# Patient Record
Sex: Female | Born: 1970 | Race: White | Hispanic: No | Marital: Married | Smoking: Never smoker
Health system: Southern US, Community
[De-identification: ages and names within clinical notes are randomized; demographics above are authoritative.]

## PROBLEM LIST (undated history)

## (undated) DIAGNOSIS — R5383 Other fatigue: Secondary | ICD-10-CM

## (undated) DIAGNOSIS — R002 Palpitations: Secondary | ICD-10-CM

## (undated) DIAGNOSIS — E6609 Other obesity due to excess calories: Secondary | ICD-10-CM

## (undated) DIAGNOSIS — E66811 Obesity, class 1: Secondary | ICD-10-CM

## (undated) DIAGNOSIS — F32A Depression, unspecified: Secondary | ICD-10-CM

## (undated) HISTORY — DX: Other fatigue: R53.83

## (undated) HISTORY — DX: Other obesity due to excess calories: E66.09

## (undated) HISTORY — DX: Depression, unspecified: F32.A

## (undated) HISTORY — DX: Obesity, class 1: E66.811

## (undated) HISTORY — DX: Palpitations: R00.2

---

## 1998-11-18 ENCOUNTER — Ambulatory Visit (HOSPITAL_BASED_OUTPATIENT_CLINIC_OR_DEPARTMENT_OTHER): Admission: RE | Admit: 1998-11-18 | Discharge: 1998-11-18 | Payer: Self-pay | Admitting: Orthopedic Surgery

## 1999-04-07 ENCOUNTER — Other Ambulatory Visit: Admission: RE | Admit: 1999-04-07 | Discharge: 1999-04-07 | Payer: Self-pay | Admitting: *Deleted

## 2000-08-22 ENCOUNTER — Other Ambulatory Visit: Admission: RE | Admit: 2000-08-22 | Discharge: 2000-08-22 | Payer: Self-pay | Admitting: *Deleted

## 2001-10-17 ENCOUNTER — Other Ambulatory Visit: Admission: RE | Admit: 2001-10-17 | Discharge: 2001-10-17 | Payer: Self-pay | Admitting: Obstetrics and Gynecology

## 2003-05-20 ENCOUNTER — Other Ambulatory Visit: Admission: RE | Admit: 2003-05-20 | Discharge: 2003-05-20 | Payer: Self-pay | Admitting: Obstetrics and Gynecology

## 2004-08-19 ENCOUNTER — Ambulatory Visit (HOSPITAL_COMMUNITY): Admission: RE | Admit: 2004-08-19 | Discharge: 2004-08-19 | Payer: Self-pay | Admitting: Orthopedic Surgery

## 2005-05-28 ENCOUNTER — Emergency Department (HOSPITAL_COMMUNITY): Admission: EM | Admit: 2005-05-28 | Discharge: 2005-05-28 | Payer: Self-pay | Admitting: Emergency Medicine

## 2005-07-17 ENCOUNTER — Ambulatory Visit: Payer: Self-pay | Admitting: Hematology & Oncology

## 2005-09-04 ENCOUNTER — Ambulatory Visit: Payer: Self-pay | Admitting: Internal Medicine

## 2005-09-19 ENCOUNTER — Ambulatory Visit: Payer: Self-pay | Admitting: Internal Medicine

## 2005-10-26 ENCOUNTER — Ambulatory Visit: Payer: Self-pay | Admitting: Internal Medicine

## 2006-03-11 ENCOUNTER — Encounter (INDEPENDENT_AMBULATORY_CARE_PROVIDER_SITE_OTHER): Payer: Self-pay | Admitting: *Deleted

## 2006-03-11 ENCOUNTER — Inpatient Hospital Stay (HOSPITAL_COMMUNITY): Admission: AD | Admit: 2006-03-11 | Discharge: 2006-03-14 | Payer: Self-pay | Admitting: Obstetrics and Gynecology

## 2006-03-18 ENCOUNTER — Inpatient Hospital Stay (HOSPITAL_COMMUNITY): Admission: AD | Admit: 2006-03-18 | Discharge: 2006-03-18 | Payer: Self-pay | Admitting: Obstetrics and Gynecology

## 2008-06-24 ENCOUNTER — Encounter: Admission: RE | Admit: 2008-06-24 | Discharge: 2008-06-24 | Payer: Self-pay | Admitting: Obstetrics and Gynecology

## 2008-06-25 ENCOUNTER — Encounter: Admission: RE | Admit: 2008-06-25 | Discharge: 2008-06-25 | Payer: Self-pay | Admitting: Obstetrics and Gynecology

## 2010-06-13 ENCOUNTER — Encounter: Payer: Self-pay | Admitting: Pulmonary Disease

## 2010-06-14 ENCOUNTER — Encounter: Payer: Self-pay | Admitting: Pulmonary Disease

## 2010-06-14 ENCOUNTER — Encounter: Admission: RE | Admit: 2010-06-14 | Discharge: 2010-06-14 | Payer: Self-pay | Admitting: Sports Medicine

## 2010-06-15 ENCOUNTER — Ambulatory Visit: Payer: Self-pay | Admitting: Pulmonary Disease

## 2010-06-15 DIAGNOSIS — R930 Abnormal findings on diagnostic imaging of skull and head, not elsewhere classified: Secondary | ICD-10-CM | POA: Insufficient documentation

## 2010-06-15 DIAGNOSIS — J45909 Unspecified asthma, uncomplicated: Secondary | ICD-10-CM | POA: Insufficient documentation

## 2010-06-29 ENCOUNTER — Ambulatory Visit: Payer: Self-pay | Admitting: Pulmonary Disease

## 2010-06-29 ENCOUNTER — Encounter: Payer: Self-pay | Admitting: Pulmonary Disease

## 2010-06-29 DIAGNOSIS — J069 Acute upper respiratory infection, unspecified: Secondary | ICD-10-CM | POA: Insufficient documentation

## 2010-08-11 ENCOUNTER — Ambulatory Visit: Payer: Self-pay | Admitting: Pulmonary Disease

## 2010-08-30 ENCOUNTER — Telehealth: Payer: Self-pay | Admitting: Pulmonary Disease

## 2010-08-30 ENCOUNTER — Encounter: Admission: RE | Admit: 2010-08-30 | Discharge: 2010-08-30 | Payer: Self-pay | Admitting: Pulmonary Disease

## 2010-08-31 DIAGNOSIS — J984 Other disorders of lung: Secondary | ICD-10-CM | POA: Insufficient documentation

## 2010-11-24 NOTE — Assessment & Plan Note (Signed)
Summary: chest pain,dizzy/abn ct/apc   Visit Type:  Initial Consult Copy to:  pcp Primary Provider/Referring Provider:  Dr. Frazier Butt  CC:  Pt here for pulmonary consult. Pt states seen by PW in the past. Pt c/o extreme chest pain radiating up right jaw and head. Pt Spiral CT Chest done yesterday.  Pt c/o fever yesterday T-100.2.  History of Present Illness: 40 yo female with abnormal CT chest.  She was in her usual state of health until 3 days ago.  She was sitting at the kitchen counter with her son when she suddenly developed severe, sharp chest pain.  This radiated to her neck and face.  She then had trouble focusing with her vision for about 3 minutes.  The intensity of the pain improved some, but she still gets discomfort when she takes deep breath.  She feels like somebody has kicked her in the back.  She has never had anything like this happen before.  She started running a low grade temperature since yesterday.  The highest her temperature has been is up to 100.46F.  She has not taken anything specifically for this.  She has also noticed more trouble with her breathing.  This is mostly with exertion.  She feels like she can't take a deep breath.  She has not had wheezing.  She has a history of asthma since childhood, and has been using her ventolin.  This has not seemed to help.  She was seen previously by Dr. Shan Levans for her asthma during her pregnancy.  Since then her asthma has been under control.  She has not had much cough.  She denies sputum production, or hemoptysis.  Her sinuses have been okay.  She does have a scratchy throat, and some hoarseness.  She gets allergies when around cats, dust, and the change of seasons.  She denies gland swelling, joint swelling, leg swelling, or skin rashes.  She does feel fatigued, but states this has been present for some time.  Her fatigue is more pronounced with her current symptom constellation.  She works as a Engineer, site in an  Scientist, forensic.  She denies sick exposure, or travel history.  There has been no animal exposures.  There is no prior history pneumonia or TB.  She has not had previous chest xrays that she can recall.  There is no history of tobacco use or exposure.  She was started on a Zpak yesterday by Dr. Margaretha Sheffield.  She has been feeling better over the past day. Lab test from August 23 showed mildly elevated ESR at 25, and increased percentage of granulocytes at 78% with total WBC count of 10.  Her ACE level was still pending.  Remainder of lab work was reviewed, and unremarkable.  She had ECG from August 22 which showed normal sinus rhythm.  CT of Chest  Procedure date:  06/14/2010  Findings:       Clinical Data:  Chest pain radiating up into the neck.  Shortness   of breath.  Fever.    CT ANGIOGRAPHY CHEST WITH CONTRAST    Technique:  Multidetector CT imaging of the chest was performed   using the standard protocol during bolus administration of   intravenous contrast.  Multiplanar CT image reconstructions   including MIPs were obtained to evaluate the vascular anatomy.    Contrast:  125 ml Omnipaque-300    Comparison:  None available.    Findings:  No filling defect is identified in the pulmonary  arterial tree to suggest pulmonary embolus.    A right hilar node measures 0.8 x 1.5 cm.  A right lower hilar node   measures 1.3 x 0.9 cm.  A left hilar lymph node measures 0.8 x 1.3   cm.  An AP window node measures 1.7 x 0.5 cm.    No pleural effusion identified.    A right middle lobe nodule measures 3 mm in diameter on image 63 of   series 5.  A separate right middle lobe nodule measures 4 mm on   image 70 of series 5.  Left lower lobe nodules include a 0.6 x 0.5   cm nodule on image 74, a 0.7 x 0.4 cm nodule on image 73, and   several additional similar sized right lower lobe nodules.  A 3 mm   nodule is noted in the left upper lobe on image 41 of series 5,   along with several small  left lower lobe pulmonary nodules.    Review of the MIP images confirms the above findings.    Comments:      IMPRESSION:    1.  Multiple scattered small pulmonary nodules in the 5 mm range,   particularly in the lower lobes, with associated borderline hilar   adenopathy.  Most of the nodules are peripheral in location,   although not all.  It is difficult to characterize the nodule   location as perilymphatic versus centrilobular, and accordingly   differential diagnostic considerations include entities such as   sarcoidosis, atypical infectious bronchiolitis, fungal disease, or   metastatic lesions.  These nodules are too small to satisfactorily   characterize by PET CT.  Correlate with the patient's symptoms and   consider CT followup in order to ensure resolution.   2.  No embolus identified.   Preventive Screening-Counseling & Management  Alcohol-Tobacco     Smoking Status: never  Current Medications (verified): 1)  Ventolin Hfa 108 (90 Base) Mcg/act Aers (Albuterol Sulfate) .... As Needed  Allergies (verified): 1)  ! 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  Smoking Status:  never  Vital Signs:  Patient profile:   40 year old female Height:      67 inches Weight:      172 pounds BMI:     27.04 O2 Sat:      100 % on Room air Temp:     99.2 degrees F oral Pulse rate:   88 / minute BP sitting:   110 / 68  (left arm) Cuff size:   regular  Vitals Entered By: Zackery Barefoot CMA (June 15, 2010 9:48 AM)  O2 Flow:  Room air CC: Pt here for pulmonary consult. Pt states seen by PW in the past. Pt c/o  extreme chest pain radiating up right jaw and head. Pt Spiral CT Chest done yesterday.  Pt c/o fever yesterday T-100.2 Comments Medications reviewed with patient Verified contact number and pharmacy with patient Zackery Barefoot CMA  June 15, 2010 9:49 AM    Physical Exam  General:  normal appearance, healthy appearing, and obese.   Eyes:  PERRLA and EOMI.   Ears:  TMs intact and clear with normal canals Nose:  Narrow nasal angles, no discharge Mouth:  No exudate Neck:  no JVD, no thyromegaly Chest Wall:  no deformities noted Lungs:  clear bilaterally to auscultation and percussion Heart:  regular rate and rhythm, S1, S2 without murmurs, rubs, gallops, or clicks Abdomen:  bowel sounds positive; abdomen soft and non-tender without masses, or organomegaly Msk:  no deformity or scoliosis noted with normal posture Pulses:  pulses normal Extremities:  no clubbing, cyanosis, edema, or deformity noted Neurologic:  normal CN II-XII and strength normal.   Cervical Nodes:  no significant adenopathy Axillary Nodes:  no significant adenopathy Psych:  alert and cooperative; normal mood and affect; normal attention span and concentration   Impression & Recommendations:  Problem # 1:  CT, CHEST, ABNORMAL (ICD-793.1)  She has acute onset of low grade fever, pleuritic type chest pain, and dyspnea on exertion.  She has radiographic findings as detailed above.  My suspicion is that this is infectious.  I have advised her to complete her course of zithromax.  Will re-assess her symptom status, and arrange for chest xray in two weeks.    Depending upon her xray findings and her clinical status at next follow up will decide if she needs to have lung tissue sampling.  Explained that if we need to proceed with biopsy, then bronchoscopy would be first option.  I have explained the procedure of bronchoscopy to the patient.  Risks were detailed as bleeding, infection, pneumothorax, and non-diagnosis.  If  she has clinically improved and her chest xray is unremarkable at next follow up in 2 weeks, then I would have her repeat CT chest in 6 to 8 weeks after her first CT chest to determine if her radiographic findings have cleared.  I have explained that waiting this amount of time is unlikely to change diagnostic or treatment options, but may allow to avoid her undergoing an invasive procedure.  Also advised that if her symptoms progress, or if she develops new symptoms, that she should call to arrange for earlier follow up evaluation.  Problem # 2:  ASTHMA (ICD-493.90) She did not have clinical evidence for bronchospasm on exam today.  I don't think she needs systemic corticosteroids at this time.  She can continue as needed ventolin.  Medications Added to Medication List This Visit: 1)  Ventolin Hfa 108 (90 Base) Mcg/act Aers (Albuterol sulfate) .... As needed 2)  Zithromax Z-pak 250 Mg Tabs (Azithromycin) .... Use as directed  Complete Medication List: 1)  Ventolin Hfa 108 (90 Base) Mcg/act Aers (Albuterol sulfate) .... As needed 2)  Zithromax Z-pak 250 Mg Tabs (Azithromycin) .... Use as directed  Other Orders: Consultation Level IV (09811)  Patient Instructions: 1)  Finish course of zithromax 2)  Ventolin 2 puffs as needed for cough, congestion, wheeze, or shortness of breath 3)  Follow up in two weeks with chest xray   Immunization History:  Influenza Immunization History:    Influenza:  historical (07/26/2009)

## 2010-11-24 NOTE — Miscellaneous (Signed)
Summary: Orders Update  Clinical Lists Changes  Orders: Added new Test order of T-2 View CXR (71020TC) - Signed 

## 2010-11-24 NOTE — Letter (Signed)
Summary: [facility]  [facility]   Imported By: Lester [place] 07/04/2010 10:58:47  _____________________________________________________________________  External Attachment:    Type:   Image     Comment:   External Document

## 2010-11-24 NOTE — Assessment & Plan Note (Signed)
Summary: rov w/ cxr 2 wks ///kp   Visit Type:  Follow-up Copy to:  pcp Primary Provider/Referring Provider:  Dr. Frazier Butt  CC:  Follow-up today with CXR...she c/o cough and nasal drainage x1 day...nasal drainage is clear...scratchy throat.  History of Present Illness: 40 yo female with abnormal CT chest.  She was doing well until the past few days.  She thinks she picked up a cold from her son.  She has been getting sinus congestion.  She also has a heavy feeling in her chest.  She has been using her inhaler more, and this helps.  She is using her ventoling twice per day.  She denies chest pain, sputum, hemoptysis, or fever.  She has an occasional cough with clear sputum.    Lab test from August 23 showed mildly elevated ESR at 25, and increased percentage of granulocytes at 78% with total WBC count of 10.  Her ACE level was still pending.  Remainder of lab work was reviewed, and unremarkable.  She had ECG from August 22 which showed normal sinus rhythm.  CXR  Procedure date:  06/29/2010  Findings:      CHEST - 2 VIEW   Comparison: Chest CT 06/14/2010   Findings: Normal mediastinum and heart silhouette.  Costophrenic angles are clear.  No evidence effusion, infiltrate, or pneumothorax.  Right basilar nodularity is barely visible but appears unchanged from CT.   IMPRESSION:   1.  No acute cardiopulmonary process. 2.  No significant change from CT of 06/14/2010.     Current Medications (verified): 1)  Ventolin Hfa 108 (90 Base) Mcg/act Aers (Albuterol Sulfate) .... As Needed 2)  Tylenol Extra Strength 500 Mg Tabs (Acetaminophen) .Marland Kitchen.. 1-2 By Mouth As Needed  Allergies (verified): 1)  ! 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   Review of Systems       The patient complains of productive cough, non-productive cough, nasal congestion/difficulty breathing through nose, and sneezing.  The patient denies shortness of breath with activity, shortness of breath at rest, coughing up blood, chest pain, irregular heartbeats, acid heartburn, indigestion, abdominal pain, difficulty swallowing, sore throat, headaches, ear ache, hand/feet swelling, joint stiffness or pain, rash, change in color of mucus, and fever.    Vital Signs:  Patient profile:   40 year old female Height:      67 inches (170.18 cm) Weight:      174.50 pounds (79.32 kg) BMI:     27.43 O2 Sat:      96 % on Room air Temp:     98.0 degrees F (36.67 degrees C) oral Pulse rate:   72 / minute BP sitting:   102 / 68  (right arm) Cuff size:   regular  Vitals Entered By: Michel Bickers CMA (June 29, 2010 3:53 PM)  O2 Sat at Rest %:  96 O2 Flow:  Room air CC: Follow-up today with CXR...she c/o cough and nasal drainage x1 day...nasal drainage is clear...scratchy throat Comments Medications reviewed with the patient. Daytime phone verified. Michel Bickers [facility]  June 29, 2010 4:10 PM   Physical Exam  General:  normal appearance, healthy appearing, and obese.  Nose:  Narrow nasal angles, clear nasal discharge, no tenderness Mouth:  No exudate Neck:  no JVD, no thyromegaly Lungs:  clear bilaterally to auscultation and percussion Heart:  regular rate and rhythm, S1, S2 without murmurs, rubs, gallops, or clicks Abdomen:  bowel sounds positive; abdomen soft and non-tender without masses, or organomegaly Extremities:  no clubbing, cyanosis, edema, or deformity noted Neurologic:  normal CN II-XII and strength normal.   Cervical Nodes:   no significant adenopathy Axillary Nodes:  no significant adenopathy   Impression & Recommendations:  Problem # 1:  CT, CHEST, ABNORMAL (ICD-793.1) Her chest xray today was unremarkable.  Will follow up in one month, and then likely plan on repeating CT chest to determine if she has persistent radiographic changes not readily apparent on chest xray.  Problem # 2:  ASTHMA (ICD-493.90) She likely has a mild flare of her asthma related to viral sinus infection.  I don't think she needs antibiotics or prednisone at this time.  I have advised her to start Qvar two puffs two times a day for 2 weeks.  If her symptoms are improved by then, she can use Qvar at bedtime until her next visit.  Will then decide if she needs long term maintenance inhaler therapy for her asthma.  Problem # 3:  URI (ICD-465.9) She likely has a viral URI.  I have recommend symptomatic therapy for now.  Advised her to call if her symptoms progress.  Medications Added to Medication List This Visit: 1)  Tylenol Extra Strength 500 Mg Tabs (Acetaminophen) .Marland Kitchen.. 1-2 by mouth as needed 2)  Qvar 80 Mcg/act Aers (Beclomethasone dipropionate) .... Two puffs two times a day for 2 weeks, then 2 puffs at bedtime  Complete Medication List: 1)  Ventolin Hfa 108 (90 Base) Mcg/act Aers (Albuterol sulfate) .... As needed 2)  Tylenol Extra Strength 500 Mg Tabs (Acetaminophen) .Marland Kitchen.. 1-2 by mouth as needed 3)  Qvar 80 Mcg/act Aers (Beclomethasone dipropionate) .... Two puffs two times a day for 2 weeks, then 2 puffs at bedtime  Other Orders: Est. Patient Level IV (16109)  Patient Instructions: 1)  Qvar two puffs two times a day for 2 weeks, then 2 puffs at bedtime until next visit 2)  Follow up in 4 weeks Prescriptions: QVAR 80 MCG/ACT AERS (BECLOMETHASONE DIPROPIONATE) two puffs two times a day for 2 weeks, then 2 puffs at bedtime  #1 x 6   Entered and Authorized by:   Coralyn Helling MD   Signed by:   Coralyn Helling MD on 06/29/2010   Method  used:   Electronically to        [facility]  [address] [phone]* (retail)       [address]       [place], [place]  [address]       Ph: [phone]       Fax: [phone]   RxID:   [id number]

## 2010-11-24 NOTE — Assessment & Plan Note (Signed)
Summary: 4 WEEKS/ MBW   Copy to:  pcp Primary Provider/Referring Provider:  Dr. Frazier Butt  CC:  Pt stopped qvar because she did not see any difference. Marland Kitchen  History of Present Illness: 40 yo female with abnormal CT chest.  Her breathing has been doing better.  She did not notice any difference from Qvar, and stopped this.  She has not needed to use ventolin for some time.  She is not having cough, wheeze, sputum, fever, sweats, chest pain, hemoptysis, sinus congestion, sore throat, gland swelling, or rash.    Current Medications (verified): 1)  Ventolin Hfa 108 (90 Base) Mcg/act Aers (Albuterol Sulfate) .... As Needed 2)  Tylenol Extra Strength 500 Mg Tabs (Acetaminophen) .Marland Kitchen.. 1-2 By Mouth As Needed  Allergies (verified): 1)  ! Lorcet 10/650 (Hydrocodone-Acetaminophen)  Past History:  Past Surgical History: Last updated: 06/15/2010 C-Section 2007 Left foot surgery  Past Medical History: Asthma Factor V leyden Multiple pulmonary nodules, mediastinal adenopathy       - CT chest 06/14/10  Vital Signs:  Patient profile:   40 year old female Height:      67 inches Weight:      175.25 pounds O2 Sat:      97 % on Room air Temp:     98.5 degrees F oral Pulse rate:   92 / minute BP sitting:   128 / 72  (right arm) Cuff size:   regular  Vitals Entered By: Carron Curie CMA (August 11, 2010 4:30 PM)  O2 Flow:  Room air CC: Pt stopped qvar because she did not see any difference.  Comments Medications reviewed with patient Carron Curie CMA  August 11, 2010 4:31 PM    Physical Exam  General:  normal appearance, healthy appearing, and obese.   Nose:  Narrow nasal angles, clear nasal discharge, no tenderness Mouth:  No exudate Neck:  no JVD, no thyromegaly Lungs:  clear bilaterally to auscultation and percussion Heart:  regular rate and rhythm, S1, S2 without murmurs, rubs, gallops, or clicks Extremities:  no clubbing, cyanosis, edema, or deformity  noted Neurologic:  normal CN II-XII and strength normal.   Cervical Nodes:  no significant adenopathy Axillary Nodes:  no significant adenopathy Psych:  alert and cooperative; normal mood and affect; normal attention span and concentration   Impression & Recommendations:  Problem # 1:  CT, CHEST, ABNORMAL (ICD-793.1) Will arrange for repeat CT chest with contrast to assess for stability/resolution of mediastinal adenopathy and pulm. nodules.  Will call her with results, and discuss if further pulmonary evaluation is needed.  Problem # 2:  ASTHMA (ICD-493.90) She has mild, intermittent asthma.  She can continue as needed ventolin.  Problem # 3:  URI (ICD-465.9) This has resolved.  Complete Medication List: 1)  Ventolin Hfa 108 (90 Base) Mcg/act Aers (Albuterol sulfate) .... As needed 2)  Tylenol Extra Strength 500 Mg Tabs (Acetaminophen) .Marland Kitchen.. 1-2 by mouth as needed  Other Orders: Est. Patient Level III (04540) Radiology Referral (Radiology)  Patient Instructions: 1)  Will schedule CT chest in November and call with results 2)  Will call to schedule follow up after CT chest done

## 2010-11-24 NOTE — Progress Notes (Signed)
Summary: RESULTS  Phone Note Call from Patient Call back at [phone]   Caller: Patient Call For: Riona Lahti Summary of Call: CALLING FOR CT SCAN RESULTS. Initial call taken by: Rickard Patience,  August 30, 2010 11:58 AM  Follow-up for Phone Call        advised not reviewed yet, but I will forward message to Dr. Craige Cotta to advise. Carron Curie CMA  August 30, 2010 12:20 PM   Additional Follow-up for Phone Call Additional follow up Details #1::        discussed results with pt over the phone. Additional Follow-up by: Coralyn Helling MD,  August 31, 2010 5:13 PM

## 2010-12-14 ENCOUNTER — Other Ambulatory Visit: Payer: Self-pay

## 2011-02-09 ENCOUNTER — Other Ambulatory Visit: Payer: Self-pay | Admitting: Pulmonary Disease

## 2011-02-09 DIAGNOSIS — J984 Other disorders of lung: Secondary | ICD-10-CM

## 2011-02-16 ENCOUNTER — Other Ambulatory Visit: Payer: Self-pay

## 2011-03-10 NOTE — Op Note (Signed)
Meredith Gould, Meredith Gould                ACCOUNT NO.:  [id number]   MEDICAL RECORD NO.:  [id number]          PATIENT TYPE:  INP   LOCATION:  9173                          FACILITY:  [facility]   PHYSICIAN:  Gerri Spore B. Earlene Plater, M.D.  DATE OF BIRTH:  [birth year]   DATE OF PROCEDURE:  03/11/2006  DATE OF DISCHARGE:                                 OPERATIVE REPORT   PREOPERATIVE DIAGNOSES:  1.  39-week intrauterine pregnancy.  2.  Chorioamnionitis.  3.  Nonreassuring fetal heart rate tracing.  4.  Failure to descend.   POSTOPERATIVE DIAGNOSES:  1.  39-week intrauterine pregnancy.  2.  Chorioamnionitis.  3.  Nonreassuring fetal heart rate tracing.  4.  Failure to descend.   PROCEDURE:  Primary low transverse cesarean section.   SURGEON:  Chester Holstein. Earlene Plater, M.D.   ASSISTANT:  None.   ANESTHESIA:  Epidural.   FINDINGS:  Viable female.  Apgars 2 and 9.  Cord pH 7.21.  7 pounds 6 ounces.  Normal-appearing uterus, tubes, and ovaries.   SPECIMENS:  Placenta.   ESTIMATED BLOOD LOSS:  700.   COMPLICATIONS:  None.   INDICATIONS FOR PROCEDURE:  The patient presented in spontaneous labor and  progressed well to 8 cm and then very slowly to complete.  She pushed for  approximately three hours with minimal descent and a significant amount of  caput.  Position was felt to be OA, and the suspicion was for a large fetus,  although maternal habitus limited clinical estimations.  Her ultrasound from  the week prior was 8 pounds 6 ounces.  Given these concerns and an onset of  fetal tachycardia to the 180s with fever of 101.4, I recommended cesarean  section.  The risks of surgery were discussed, including infection,  bleeding, and damage to surrounding organs.   DESCRIPTION OF PROCEDURE:  The patient was taken to the operating room with  epidural anesthesia in place.  She was prepped and draped in the standard  fashion, with a Foley catheter in the bladder.   A Pfannenstiel incision was made and carried  sharply to the fascia.  The  fascia was divided sharply.  The underlying rectus muscles were dissected  off sharply.  The posterior sheath and peritoneum were elevated and entered  sharply.  The bladder flap was created with sharp and blunt technique.   The uterine incision was made in a low transverse fashion with the knife.  Clear fluid at amniotomy.  Extended laterally bluntly.  The infant's head  was elevated up to the incision with the assistance of a hand from below.  The head felt very wedged into the pelvic inlet, consistent possibly with a  somewhat narrow inlet.  The head was delivered.  The nose and mouth were  suctioned with the bulb.  The remainder of the infant was delivered without  difficulty.  1 g of Cefotan given at cord clamp.  The infant was handed off  to the awaiting pediatricians.  Cord pH was obtained.   The placenta was removed manually.  The uterus was left in the abdomen.  The  uterus was cleared of all clots and debris.  The uterine incision was free  of extension.  It was closed in a running locked stitch of 0 chromic and a  second imbricating layer placed, with hemostasis obtained.  The tubes and  ovaries appeared normal.   The pelvis was irrigated.  The uterine incision, bladder flap, and  subfascial space were all hemostatic.  The fascia was closed in a running  stitch of 0 Vicryl.  The subcutaneous tissue was reapproximated with a  running stitch of 0 Vicryl.  The skin was closed with staples.   The patient tolerated the procedure well with no complications.  She was  taken to the recovery room in stable condition.  Counts were correct per the  operating room staff.      Gerri Spore B. Earlene Plater, M.D.  Electronically Signed     WBD/MEDQ  D:  03/11/2006  T:  03/12/2006  Job:  [id number]

## 2011-03-10 NOTE — Discharge Summary (Signed)
Meredith Gould, Meredith Gould                ACCOUNT NO.:  [id number]   MEDICAL RECORD NO.:  [id number]          PATIENT TYPE:  INP   LOCATION:  9121                          FACILITY:  [facility]   PHYSICIAN:  Gerri Spore B. Earlene Plater, M.D.  DATE OF BIRTH:  12-01-1970   DATE OF ADMISSION:  03/11/2006  DATE OF DISCHARGE:  03/14/2006                                 DISCHARGE SUMMARY   ADMISSION DIAGNOSIS:  1.  A 39-week intrauterine pregnancy.  2.  Spontaneous labor.  3.  History of factor IV Leiden heterozygote on heparin prophylaxis.  4.  Failure to descend and chorioamnionitis.  5.  Non-reassuring fetal heart rate tracing.   DISCHARGE DIAGNOSIS:  1.  A 39-week intrauterine pregnancy.  2.  Spontaneous labor.  3.  History of factor IV Leiden heterozygote on heparin prophylaxis.  4.  Failure to descend and chorioamnionitis.  5.  Non-reassuring fetal heart rate tracing.   HISTORY OF PRESENT ILLNESS:  The patient presents in spontaneous labor and  progressed to complete.  She had essentially no descent for almost 3 hours.  She developed a fever to 101.  Fetal heart rate in the 180s with  decelerations down to the 100s.  Therefore I recommended cesarean section.  Findings at the time of surgery included a viable female, Apgars 2 and 9, cord  pH 7.21, 7 pounds 6 ounces, normal appearing uterus, tubes and ovaries.   Postoperatively the patient did well and defervesced with 24- hours of  Ancef.  She was replaced on her Lovenox and discharged to home on the third  postoperative day.   DISCHARGE MEDICATIONS:  Lovenox 40 mg subcu daily, Percocet 1 to 2 p.o. q.4-  6 h p.r.n. pain.   FOLLOW UP:  [facility] Dr. Earlene Plater in 6 weeks.   DISPOSITION:  Discharged satisfactory.      Gerri Spore B. Earlene Plater, M.D.  Electronically Signed     WBD/MEDQ  D:  04/05/2006  T:  04/05/2006  Job:  [id number]

## 2011-03-10 NOTE — Consult Note (Signed)
NAMEDEETTE, REVAK                ACCOUNT NO.:  [id number]   MEDICAL RECORD NO.:  [id number]          PATIENT TYPE:  MAT   LOCATION:  [facility]                          FACILITY:  [facility]   PHYSICIAN:  Richardean Sale, M.D.   DATE OF BIRTH:  [birth year]   DATE OF CONSULTATION:  03/18/2006  DATE OF DISCHARGE:                                   CONSULTATION   CHIEF COMPLAINT:  Redness above recent C-section incision.   HISTORY OF PRESENT ILLNESS:  This is a 40 year old gravida 1, para 1, white  female who is status post cesarean delivery for cephalopelvic disproportion  and failure to progress approximately one week ago.  The patient has noted  increasing redness around her incision and warmth to the area.  She denies  any fevers, chills, or sweats and has had no drainage from the incision.  She denies any foul-smelling lochia.  She has been using Percocet for pain  but complains it makes her feel too drowsy.   OBJECTIVE:  VITAL SIGNS:  Temp 98.1, respirations 20, pulse 79, blood  pressure 118/78.  GENERAL:  She is a well-developed, well-nourished white female who is in no  acute distress.  ABDOMEN:  Soft, mildly tender to palpation over her recent incision.  Nondistended.  No discharge noted from the incision.  The edges are well  approximated with Steri-Strips in place.  There is an approximately 2 x 5-cm  area of redness just above the incision that is mildly warm to the touch.   ASSESSMENT:  Would cellulitis.   PLAN:  1.  Will start Keflex 500 mg p.o. q.i.d. for seven days.  2.  The patient is to call for fever greater than 101 or any drainage from      her incision or any spreading redness or increasing pain.  3.  Will switch to Darvocet-N 100 one to two tabs p.o. q.4-6h. p.r.n. pain      as the patient complains the Percocet is making her too drowsy.  4.  The patient is to follow up per her scheduled postpartum visit.      Richardean Sale, M.D.  Electronically Signed     JW/MEDQ  D:  03/18/2006  T:  03/19/2006  Job:  [id number]

## 2012-05-07 ENCOUNTER — Other Ambulatory Visit: Payer: Self-pay | Admitting: Obstetrics and Gynecology

## 2012-05-07 DIAGNOSIS — Z1231 Encounter for screening mammogram for malignant neoplasm of breast: Secondary | ICD-10-CM

## 2012-05-23 ENCOUNTER — Ambulatory Visit
Admission: RE | Admit: 2012-05-23 | Discharge: 2012-05-23 | Disposition: A | Discharge status: Home / Self Care | Payer: BC Managed Care – PPO | Source: Ambulatory Visit | Attending: Obstetrics and Gynecology | Admitting: Obstetrics and Gynecology

## 2012-05-23 DIAGNOSIS — Z1231 Encounter for screening mammogram for malignant neoplasm of breast: Secondary | ICD-10-CM

## 2012-05-28 ENCOUNTER — Other Ambulatory Visit: Payer: Self-pay | Admitting: Obstetrics and Gynecology

## 2012-05-28 DIAGNOSIS — R928 Other abnormal and inconclusive findings on diagnostic imaging of breast: Secondary | ICD-10-CM

## 2012-05-29 ENCOUNTER — Ambulatory Visit
Admission: RE | Admit: 2012-05-29 | Discharge: 2012-05-29 | Disposition: A | Discharge status: Home / Self Care | Payer: BC Managed Care – PPO | Source: Ambulatory Visit | Attending: Obstetrics and Gynecology | Admitting: Obstetrics and Gynecology

## 2012-05-29 DIAGNOSIS — R928 Other abnormal and inconclusive findings on diagnostic imaging of breast: Secondary | ICD-10-CM

## 2012-06-10 ENCOUNTER — Other Ambulatory Visit: Payer: BC Managed Care – PPO

## 2013-01-24 DIAGNOSIS — G8929 Other chronic pain: Secondary | ICD-10-CM | POA: Insufficient documentation

## 2013-01-24 DIAGNOSIS — N200 Calculus of kidney: Secondary | ICD-10-CM | POA: Insufficient documentation

## 2013-07-23 ENCOUNTER — Other Ambulatory Visit: Payer: Self-pay

## 2013-07-23 DIAGNOSIS — Z1231 Encounter for screening mammogram for malignant neoplasm of breast: Secondary | ICD-10-CM

## 2013-08-15 ENCOUNTER — Ambulatory Visit: Payer: BC Managed Care – PPO

## 2013-09-10 ENCOUNTER — Ambulatory Visit
Admission: RE | Admit: 2013-09-10 | Discharge: 2013-09-10 | Disposition: A | Discharge status: Home / Self Care | Payer: BC Managed Care – PPO | Source: Ambulatory Visit

## 2013-09-10 DIAGNOSIS — Z1231 Encounter for screening mammogram for malignant neoplasm of breast: Secondary | ICD-10-CM

## 2013-11-05 DIAGNOSIS — R519 Headache, unspecified: Secondary | ICD-10-CM | POA: Insufficient documentation

## 2014-03-30 ENCOUNTER — Other Ambulatory Visit (HOSPITAL_COMMUNITY): Payer: Self-pay | Admitting: Cardiology

## 2014-03-30 ENCOUNTER — Ambulatory Visit (HOSPITAL_COMMUNITY): Payer: BC Managed Care – PPO | Attending: Orthopedic Surgery | Admitting: Cardiology

## 2014-03-30 DIAGNOSIS — M79609 Pain in unspecified limb: Secondary | ICD-10-CM

## 2014-03-30 DIAGNOSIS — M7989 Other specified soft tissue disorders: Secondary | ICD-10-CM

## 2014-03-30 NOTE — Progress Notes (Signed)
Lower venous duplex unilateral completed. 

## 2014-08-25 ENCOUNTER — Other Ambulatory Visit: Payer: Self-pay

## 2014-08-25 DIAGNOSIS — Z1231 Encounter for screening mammogram for malignant neoplasm of breast: Secondary | ICD-10-CM

## 2014-09-11 ENCOUNTER — Ambulatory Visit
Admission: RE | Admit: 2014-09-11 | Discharge: 2014-09-11 | Disposition: A | Discharge status: Home / Self Care | Payer: BC Managed Care – PPO | Source: Ambulatory Visit

## 2014-09-11 DIAGNOSIS — Z1231 Encounter for screening mammogram for malignant neoplasm of breast: Secondary | ICD-10-CM

## 2015-03-08 ENCOUNTER — Other Ambulatory Visit: Payer: Self-pay | Admitting: Family Medicine

## 2015-03-08 DIAGNOSIS — R1012 Left upper quadrant pain: Secondary | ICD-10-CM

## 2015-03-19 ENCOUNTER — Ambulatory Visit
Admission: RE | Admit: 2015-03-19 | Discharge: 2015-03-19 | Disposition: A | Discharge status: Home / Self Care | Payer: Self-pay | Source: Ambulatory Visit | Attending: Family Medicine | Admitting: Family Medicine

## 2015-03-19 DIAGNOSIS — R1012 Left upper quadrant pain: Secondary | ICD-10-CM

## 2016-07-19 ENCOUNTER — Other Ambulatory Visit: Payer: Self-pay | Admitting: Family Medicine

## 2016-07-19 DIAGNOSIS — Z1231 Encounter for screening mammogram for malignant neoplasm of breast: Secondary | ICD-10-CM

## 2016-07-26 ENCOUNTER — Ambulatory Visit
Admission: RE | Admit: 2016-07-26 | Discharge: 2016-07-26 | Disposition: A | Discharge status: Home / Self Care | Payer: BLUE CROSS/BLUE SHIELD | Source: Ambulatory Visit | Attending: Family Medicine | Admitting: Family Medicine

## 2016-07-26 DIAGNOSIS — Z1231 Encounter for screening mammogram for malignant neoplasm of breast: Secondary | ICD-10-CM

## 2016-08-14 DIAGNOSIS — N841 Polyp of cervix uteri: Secondary | ICD-10-CM | POA: Insufficient documentation

## 2017-07-26 ENCOUNTER — Other Ambulatory Visit: Payer: Self-pay | Admitting: Obstetrics and Gynecology

## 2017-07-26 DIAGNOSIS — Z1239 Encounter for other screening for malignant neoplasm of breast: Secondary | ICD-10-CM

## 2017-08-06 ENCOUNTER — Ambulatory Visit
Admission: RE | Admit: 2017-08-06 | Discharge: 2017-08-06 | Disposition: A | Discharge status: Home / Self Care | Payer: BLUE CROSS/BLUE SHIELD | Source: Ambulatory Visit | Attending: Obstetrics and Gynecology | Admitting: Obstetrics and Gynecology

## 2017-08-06 DIAGNOSIS — Z1239 Encounter for other screening for malignant neoplasm of breast: Secondary | ICD-10-CM

## 2020-05-28 ENCOUNTER — Other Ambulatory Visit: Payer: Self-pay | Admitting: Obstetrics and Gynecology

## 2020-05-28 DIAGNOSIS — Z1231 Encounter for screening mammogram for malignant neoplasm of breast: Secondary | ICD-10-CM

## 2020-06-09 ENCOUNTER — Ambulatory Visit: Payer: BLUE CROSS/BLUE SHIELD

## 2020-06-11 ENCOUNTER — Other Ambulatory Visit: Payer: Self-pay

## 2020-06-11 ENCOUNTER — Ambulatory Visit
Admission: RE | Admit: 2020-06-11 | Discharge: 2020-06-11 | Disposition: A | Discharge status: Home / Self Care | Payer: PRIVATE HEALTH INSURANCE | Source: Ambulatory Visit | Attending: Obstetrics and Gynecology | Admitting: Obstetrics and Gynecology

## 2020-06-11 DIAGNOSIS — Z1231 Encounter for screening mammogram for malignant neoplasm of breast: Secondary | ICD-10-CM

## 2020-06-24 ENCOUNTER — Other Ambulatory Visit: Payer: Self-pay

## 2020-06-25 LAB — SARS CORONAVIRUS 2 (TAT 6-24 HRS): SARS Coronavirus 2: NEGATIVE

## 2021-09-07 ENCOUNTER — Other Ambulatory Visit: Payer: Self-pay | Admitting: Obstetrics and Gynecology

## 2021-09-07 DIAGNOSIS — Z1231 Encounter for screening mammogram for malignant neoplasm of breast: Secondary | ICD-10-CM

## 2021-10-12 ENCOUNTER — Ambulatory Visit: Payer: PRIVATE HEALTH INSURANCE

## 2021-10-30 IMAGING — MG DIGITAL SCREENING BILAT W/ TOMO W/ CAD
6 of 10 series · 6 of 30 positions shown · non-contrast
Comparison: Previous exam(s).

CLINICAL DATA: Screening.

EXAM:
DIGITAL SCREENING BILATERAL MAMMOGRAM WITH TOMO AND CAD

[R CC synth-2D]
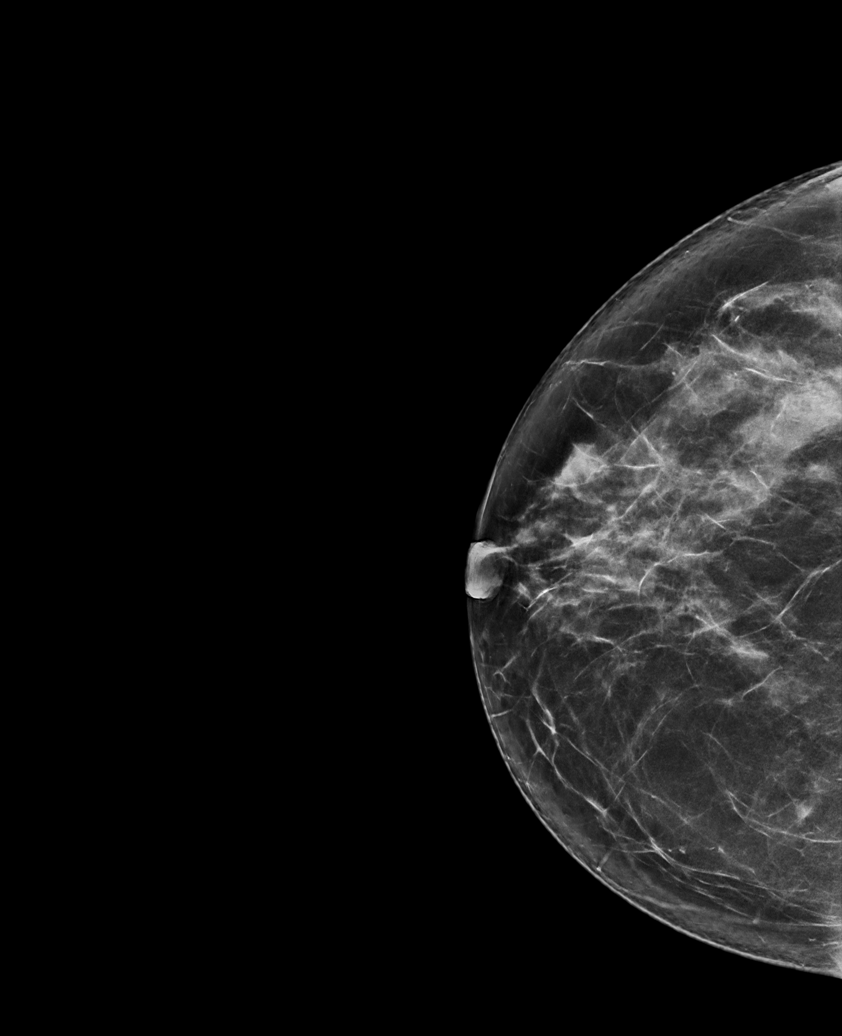

[L CC synth-2D (1 of 2)]
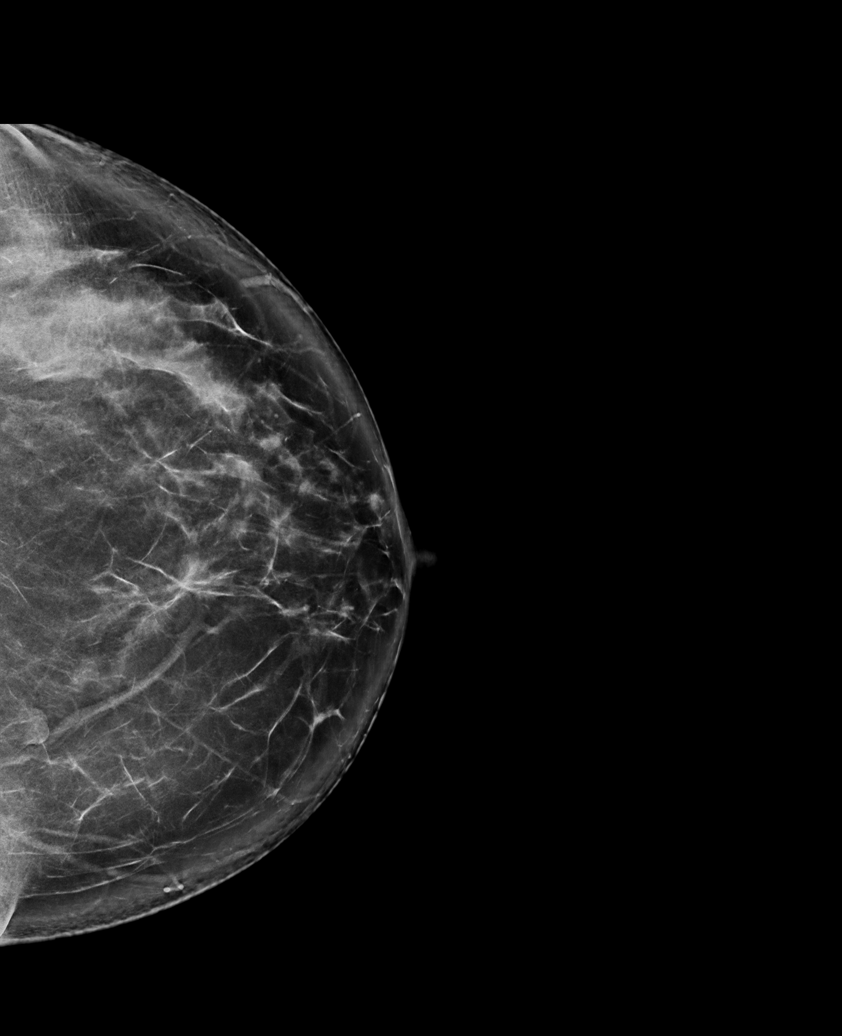

[L CC synth-2D (2 of 2)]
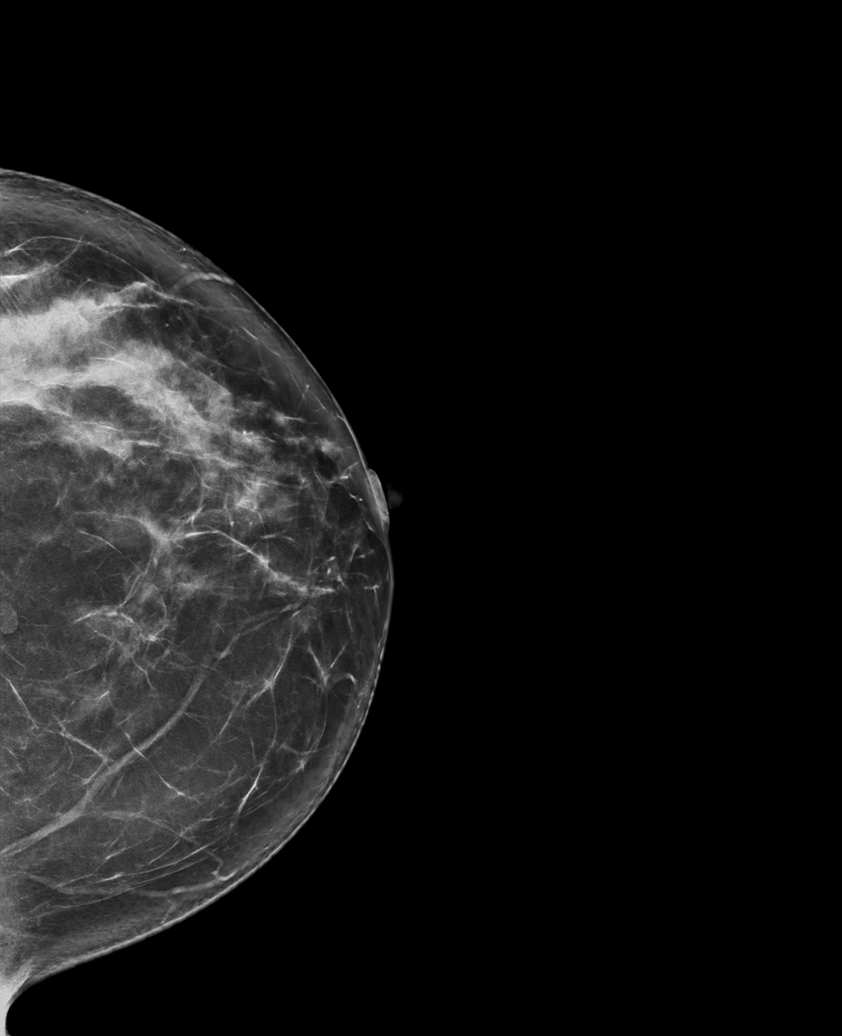

[L MLO synth-2D]
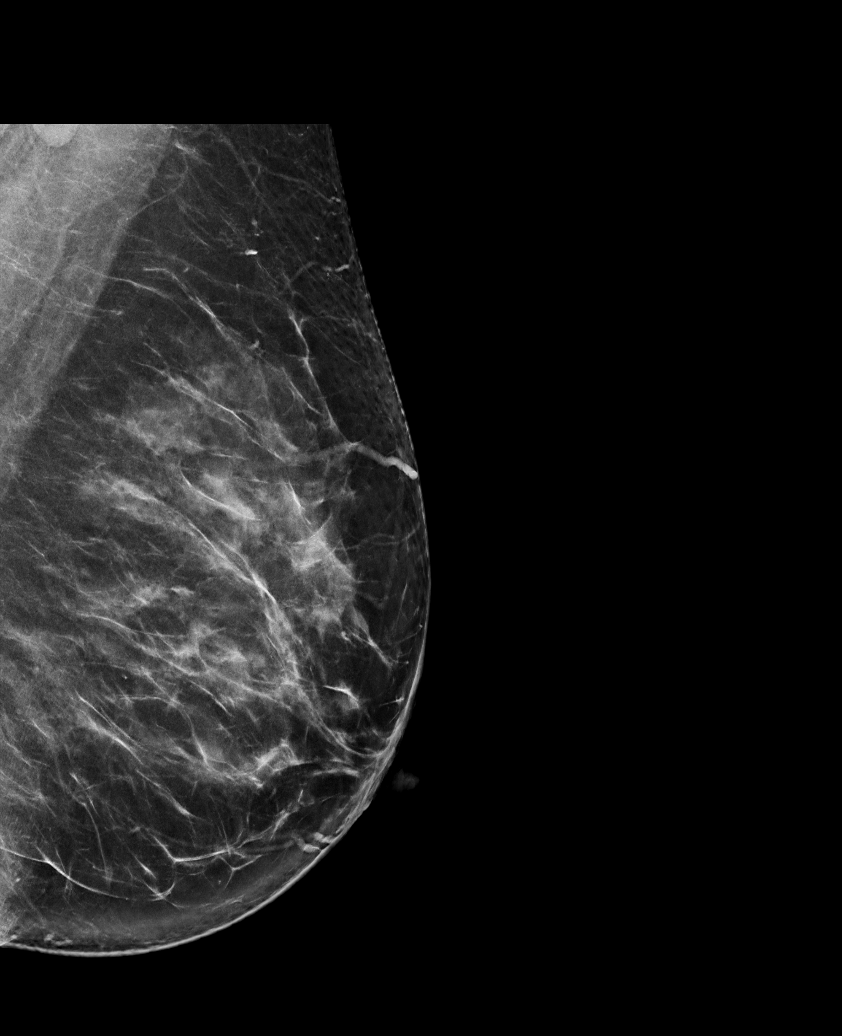

[R MLO synth-2D]
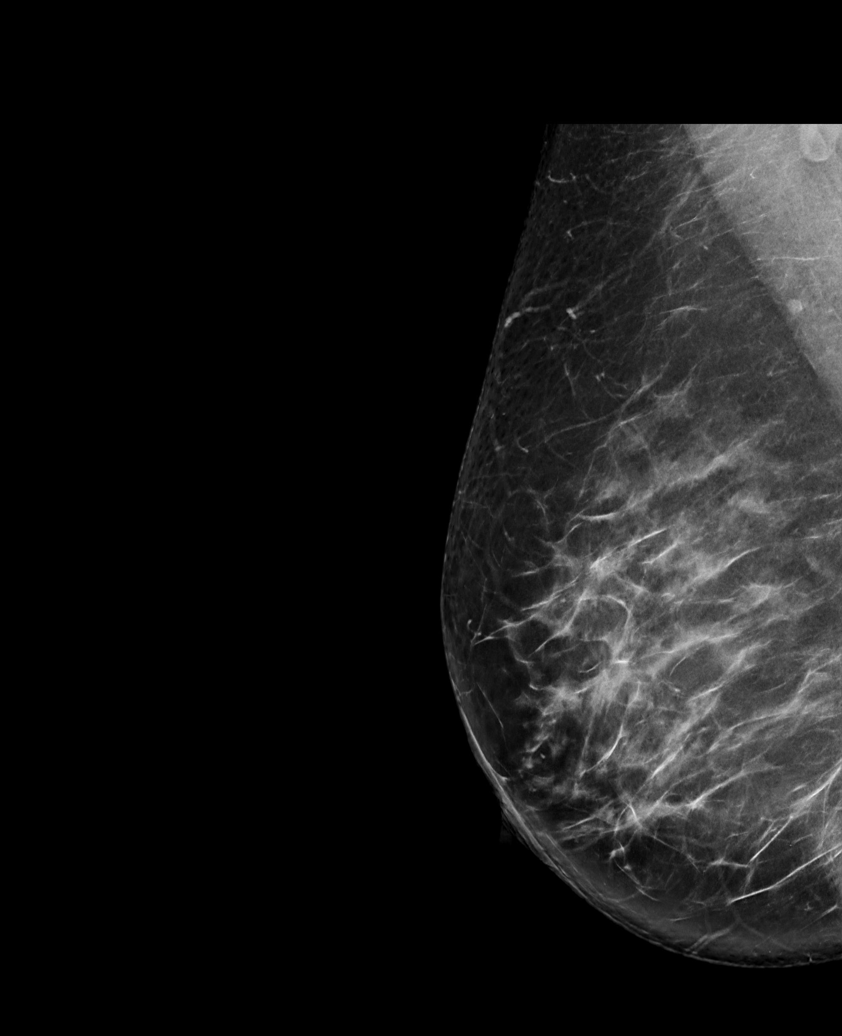

[R CC tomo · tomo slice 41/82.0]
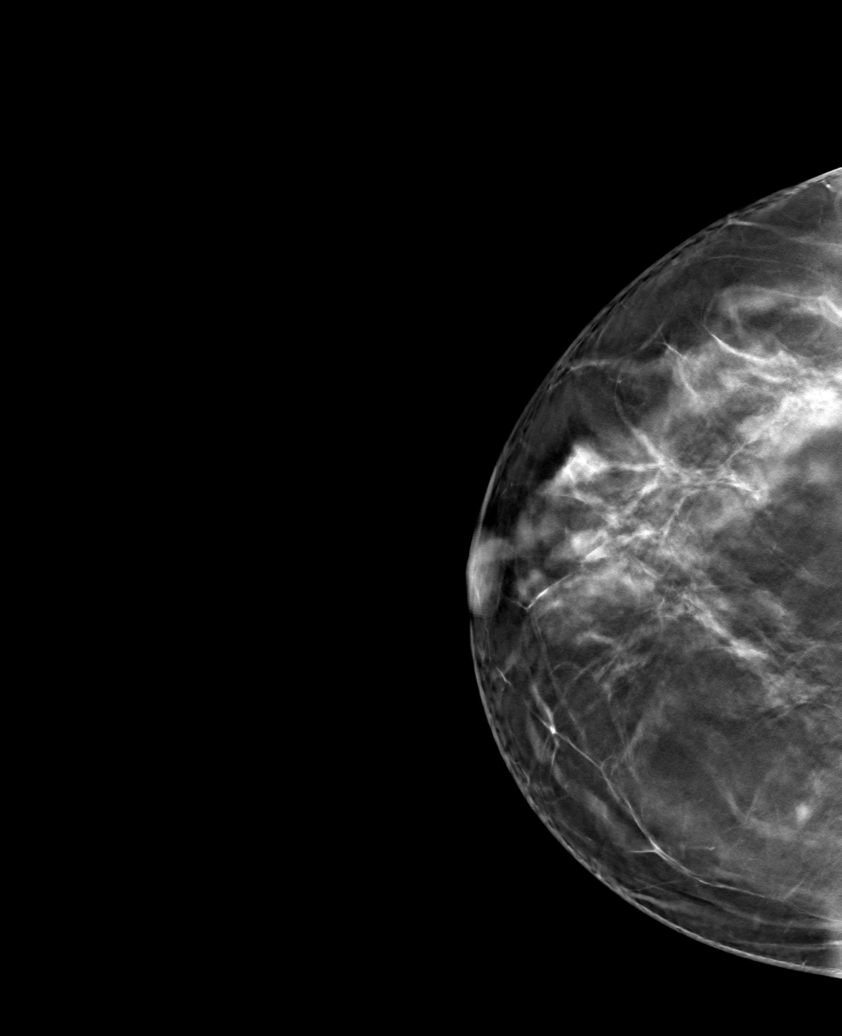

[6 of 30 positions shown; findings below may reference images not displayed]

ACR Breast Density Category c: The breast tissue is heterogeneously
dense, which may obscure small masses.
FINDINGS: There are no findings suspicious for malignancy. Images were
processed with CAD.
IMPRESSION: No mammographic evidence of malignancy. A result letter of this
screening mammogram will be mailed directly to the patient.

RECOMMENDATION:
Screening mammogram in one year. (Code:FT-U-LHB)

BI-RADS CATEGORY  1: Negative.

## 2021-11-03 DIAGNOSIS — F329 Major depressive disorder, single episode, unspecified: Secondary | ICD-10-CM | POA: Insufficient documentation

## 2021-11-03 DIAGNOSIS — J45909 Unspecified asthma, uncomplicated: Secondary | ICD-10-CM | POA: Insufficient documentation

## 2021-11-03 DIAGNOSIS — N915 Oligomenorrhea, unspecified: Secondary | ICD-10-CM | POA: Insufficient documentation

## 2021-11-03 DIAGNOSIS — D682 Hereditary deficiency of other clotting factors: Secondary | ICD-10-CM | POA: Insufficient documentation

## 2021-12-20 ENCOUNTER — Other Ambulatory Visit (HOSPITAL_COMMUNITY): Payer: Self-pay | Admitting: Orthopaedic Surgery

## 2021-12-20 DIAGNOSIS — M79604 Pain in right leg: Secondary | ICD-10-CM

## 2021-12-21 ENCOUNTER — Ambulatory Visit (HOSPITAL_COMMUNITY)
Admission: RE | Admit: 2021-12-21 | Discharge: 2021-12-21 | Disposition: A | Discharge status: Home / Self Care | Payer: No Typology Code available for payment source | Source: Ambulatory Visit | Attending: Orthopaedic Surgery | Admitting: Orthopaedic Surgery

## 2021-12-21 ENCOUNTER — Other Ambulatory Visit: Payer: Self-pay

## 2021-12-21 DIAGNOSIS — M79604 Pain in right leg: Secondary | ICD-10-CM

## 2021-12-28 DIAGNOSIS — N926 Irregular menstruation, unspecified: Secondary | ICD-10-CM | POA: Insufficient documentation

## 2022-10-20 ENCOUNTER — Other Ambulatory Visit: Payer: Self-pay | Admitting: Obstetrics and Gynecology

## 2022-10-20 DIAGNOSIS — Z1231 Encounter for screening mammogram for malignant neoplasm of breast: Secondary | ICD-10-CM

## 2022-10-25 ENCOUNTER — Ambulatory Visit
Admission: RE | Admit: 2022-10-25 | Discharge: 2022-10-25 | Disposition: A | Discharge status: Home / Self Care | Payer: No Typology Code available for payment source | Source: Ambulatory Visit | Attending: Obstetrics and Gynecology | Admitting: Obstetrics and Gynecology

## 2022-10-25 DIAGNOSIS — Z1231 Encounter for screening mammogram for malignant neoplasm of breast: Secondary | ICD-10-CM

## 2022-12-28 DIAGNOSIS — E559 Vitamin D deficiency, unspecified: Secondary | ICD-10-CM | POA: Insufficient documentation

## 2022-12-29 ENCOUNTER — Encounter: Payer: Self-pay | Admitting: Nurse Practitioner

## 2022-12-29 ENCOUNTER — Ambulatory Visit: Payer: No Typology Code available for payment source | Admitting: Nurse Practitioner

## 2022-12-29 VITALS — BP 114/74 | HR 60 | Temp 98.5°F | Ht 67.0 in | Wt 200.1 lb

## 2022-12-29 DIAGNOSIS — F321 Major depressive disorder, single episode, moderate: Secondary | ICD-10-CM | POA: Diagnosis not present

## 2022-12-29 DIAGNOSIS — R5383 Other fatigue: Secondary | ICD-10-CM | POA: Insufficient documentation

## 2022-12-29 DIAGNOSIS — J4520 Mild intermittent asthma, uncomplicated: Secondary | ICD-10-CM | POA: Insufficient documentation

## 2022-12-29 DIAGNOSIS — Z6831 Body mass index (BMI) 31.0-31.9, adult: Secondary | ICD-10-CM

## 2022-12-29 DIAGNOSIS — E669 Obesity, unspecified: Secondary | ICD-10-CM

## 2022-12-29 DIAGNOSIS — R002 Palpitations: Secondary | ICD-10-CM | POA: Diagnosis not present

## 2022-12-29 DIAGNOSIS — D6851 Activated protein C resistance: Secondary | ICD-10-CM | POA: Insufficient documentation

## 2022-12-29 LAB — CBC
HCT: 39 % (ref 36.0–46.0)
Hemoglobin: 13.1 g/dL (ref 12.0–15.0)
MCHC: 33.6 g/dL (ref 30.0–36.0)
MCV: 89.7 fl (ref 78.0–100.0)
Platelets: 323 10*3/uL (ref 150.0–400.0)
RBC: 4.35 Mil/uL (ref 3.87–5.11)
RDW: 13.2 % (ref 11.5–15.5)
WBC: 7 10*3/uL (ref 4.0–10.5)

## 2022-12-29 LAB — LIPID PANEL
Cholesterol: 176 mg/dL (ref 0–200)
HDL: 42.9 mg/dL (ref >39.00)
LDL Cholesterol: 111 mg/dL — ABNORMAL HIGH (ref 0–99)
NonHDL: 133
Total CHOL/HDL Ratio: 4
Triglycerides: 108 mg/dL (ref 0.0–149.0)
VLDL: 21.6 mg/dL (ref 0.0–40.0)

## 2022-12-29 LAB — HEMOGLOBIN A1C: Hgb A1c MFr Bld: 5.2 % (ref 4.6–6.5)

## 2022-12-29 LAB — COMPREHENSIVE METABOLIC PANEL
ALT: 23 U/L (ref 0–35)
AST: 19 U/L (ref 0–37)
Albumin: 4.2 g/dL (ref 3.5–5.2)
Alkaline Phosphatase: 77 U/L (ref 39–117)
BUN: 13 mg/dL (ref 6–23)
CO2: 31 mEq/L (ref 19–32)
Calcium: 9.8 mg/dL (ref 8.4–10.5)
Chloride: 103 mEq/L (ref 96–112)
Creatinine, Ser: 0.73 mg/dL (ref 0.40–1.20)
GFR: 95.06 mL/min (ref >60.00)
Glucose, Bld: 89 mg/dL (ref 70–99)
Potassium: 4.7 mEq/L (ref 3.5–5.1)
Sodium: 140 mEq/L (ref 135–145)
Total Bilirubin: 0.5 mg/dL (ref 0.2–1.2)
Total Protein: 7.5 g/dL (ref 6.0–8.3)

## 2022-12-29 NOTE — Assessment & Plan Note (Signed)
Chronic, labs ordered for further evaluation.  Further recommendations may be made based upon the results. 

## 2022-12-29 NOTE — Assessment & Plan Note (Addendum)
Chronic, most likely multifactorial in etiology.  Patient encouraged to continue vitamin D supplementation.  Will also order additional labs for further evaluation.  Per shared decision making patient will slowly wean herself off of venlafaxine and we will try Wellbutrin in its place to see if this helps with fatigue, depression, and ability to focus.  Plan to prescribe Wellbutrin pending CMP results.

## 2022-12-29 NOTE — Progress Notes (Signed)
New Patient Office Visit  Subjective    Patient ID: Meredith Gould, female    DOB: 01-24-1971  Age: 52 y.o. MRN: [id number]  CC:  Chief Complaint  Patient presents with   New Patient (Initial Visit)    Establish care,    HPI Meredith Gould presents to establish care Patient has not had PCP consistently for a while.  Is now establishing so that she has a primary care provider to see in the event she needs them as well as for general management of her health care.  Has recently been dealing with perimenopause and menopausal symptoms, as well as some depression.  On venlafaxine to help treat both hot flashes and depression.  She reports has been having difficulty with gaining weight and trying to lose it, also having difficulty focusing at work.  Recently found to be deficient in vitamin D, on vitamin D supplementation currently as prescribed by OB/GYN.  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  A few weeks ago was having cardiac palpitations more regularly which was very concerning to her.  Has history of factor V Leyden mutation.  Denies any history of clots.  Outpatient Encounter Medications as of 12/29/2022  Medication Sig   Vitamin D, Ergocalciferol, (DRISDOL) 1.25 MG (50000 UNIT) CAPS capsule Take 50,000 Units by mouth once a week.   venlafaxine XR (EFFEXOR-XR) 75 MG 24 hr capsule Take 1 tablet by mouth daily.   No facility-administered encounter medications on file as of 12/29/2022.    No past medical history on file.  No past surgical history on file.  No family history on file.  Social History   Socioeconomic History   Marital status: Married    Spouse name: Not on file   Number of children: Not on file   Years of education: Not on file   Highest education level: Not on file  Occupational History   Not on file  Tobacco Use   Smoking status: Not on file   Smokeless tobacco: Not on file  Substance and Sexual Activity   Alcohol use: Not on  file   Drug use: Not on file   Sexual activity: Not on file  Other Topics Concern   Not on file  Social History Narrative   Not on file   Social Determinants of Health   Financial Resource Strain: Not on file  Food Insecurity: Not on file  Transportation Needs: Not on file  Physical Activity: Not on file  Stress: Not on file  Social Connections: Not on file  Intimate Partner Violence: Not on file    Review of Systems  Constitutional:  Positive for malaise/fatigue. Negative for chills, diaphoresis, fever and weight loss.  Respiratory:  Negative for shortness of breath.   Cardiovascular:  Positive for palpitations (intermittent, has hx of PVCs). Negative for chest pain.  Gastrointestinal:  Negative for abdominal pain and blood in stool.  Psychiatric/Behavioral:  Negative for depression and suicidal ideas.         Objective    BP 114/74   Pulse 60   Temp 98.5 F (36.9 C) (Temporal)   Ht '5\' 7"'$  (1.702 m)   Wt 200 lb 2 oz (90.8 kg)   SpO2 98%   BMI 31.34 kg/m   Physical Exam Vitals reviewed.  Constitutional:      General: She is not in acute distress.    Appearance: Normal appearance.  HENT:     Head: Normocephalic and atraumatic.  Neck:  Vascular: No carotid bruit.  Cardiovascular:     Rate and Rhythm: Normal rate and regular rhythm.     Pulses: Normal pulses.     Heart sounds: Normal heart sounds.  Pulmonary:     Effort: Pulmonary effort is normal.     Breath sounds: Normal breath sounds.  Skin:    General: Skin is warm and dry.  Neurological:     General: No focal deficit present.     Mental Status: She is alert and oriented to person, place, and time.  Psychiatric:        Mood and Affect: Mood normal.        Behavior: Behavior normal.        Judgment: Judgment normal.         Assessment & Plan:   Problem List Items Addressed This Visit       Other   Major depressive disorder    Chronic, per shared decision making we will plan on slowly  weaning off of venlafaxine and then starting Wellbutrin.  Will prescribe Wellbutrin pending lab results.      Relevant Medications   venlafaxine XR (EFFEXOR-XR) 75 MG 24 hr capsule   Fatigue - Primary    Chronic, most likely multifactorial in etiology.  Patient encouraged to continue vitamin D supplementation.  Will also order additional labs for further evaluation.  Per shared decision making patient will slowly wean herself off of venlafaxine and we will try Wellbutrin in its place to see if this helps with fatigue, depression, and ability to focus.  Plan to prescribe Wellbutrin pending CMP results.      Relevant Orders   Hemoglobin A1c   Comprehensive metabolic panel   Lipid panel   CBC   Class 1 obesity without serious comorbidity with body mass index (BMI) of 31.0 to 31.9 in adult    Chronic, labs ordered for further evaluation.  Further recommendations may be made based upon the results.      Relevant Orders   Hemoglobin A1c   Comprehensive metabolic panel   Lipid panel   CBC   Palpitations    Chronic, intermittent.  Referral to cardiology made today.      Relevant Orders   Ambulatory referral to Cardiology    Return in about 1 month (around 01/29/2023) for F/U with Dorissa Stinnette.   Ailene Ards, NP

## 2022-12-29 NOTE — Patient Instructions (Signed)
Slowly stop the venlafaxine -- GO back to 37.'5mg'$ /day for 1 week, then take every other day for a week. Then we will start wellbutrin '150mg'$ /day. I will send in prescription to pharmacy if your kidney function is normal.

## 2022-12-29 NOTE — Assessment & Plan Note (Signed)
Chronic, per shared decision making we will plan on slowly weaning off of venlafaxine and then starting Wellbutrin.  Will prescribe Wellbutrin pending lab results.

## 2022-12-29 NOTE — Assessment & Plan Note (Signed)
Chronic, intermittent.  Referral to cardiology made today.

## 2023-01-03 ENCOUNTER — Encounter: Payer: Self-pay | Admitting: Nurse Practitioner

## 2023-01-03 ENCOUNTER — Other Ambulatory Visit: Payer: Self-pay | Admitting: Nurse Practitioner

## 2023-01-03 DIAGNOSIS — F321 Major depressive disorder, single episode, moderate: Secondary | ICD-10-CM

## 2023-01-03 MED ORDER — BUPROPION HCL ER (XL) 150 MG PO TB24: 150.0000 mg | ORAL_TABLET | Freq: Every day | ORAL | 2 refills | Status: DC

## 2023-01-03 NOTE — Progress Notes (Signed)
Estimated Creatinine Clearance: 96.3 mL/min (by C-G formula based on SCr of 0.73 mg/dL).

## 2023-01-18 ENCOUNTER — Ambulatory Visit (HOSPITAL_BASED_OUTPATIENT_CLINIC_OR_DEPARTMENT_OTHER): Payer: No Typology Code available for payment source | Admitting: Cardiology

## 2023-02-01 ENCOUNTER — Other Ambulatory Visit: Payer: Self-pay | Admitting: Nurse Practitioner

## 2023-02-01 DIAGNOSIS — F321 Major depressive disorder, single episode, moderate: Secondary | ICD-10-CM

## 2023-02-02 ENCOUNTER — Ambulatory Visit: Payer: No Typology Code available for payment source | Admitting: Nurse Practitioner

## 2023-02-02 VITALS — BP 114/78 | HR 80 | Temp 97.8°F | Ht 67.0 in | Wt 203.5 lb

## 2023-02-02 DIAGNOSIS — F321 Major depressive disorder, single episode, moderate: Secondary | ICD-10-CM | POA: Diagnosis not present

## 2023-02-02 DIAGNOSIS — J4520 Mild intermittent asthma, uncomplicated: Secondary | ICD-10-CM

## 2023-02-02 DIAGNOSIS — R41840 Attention and concentration deficit: Secondary | ICD-10-CM | POA: Diagnosis not present

## 2023-02-02 MED ORDER — ALBUTEROL SULFATE HFA 108 (90 BASE) MCG/ACT IN AERS: 2.0000 | INHALATION_SPRAY | Freq: Four times a day (QID) | RESPIRATORY_TRACT | 0 refills | Status: AC | PRN

## 2023-02-02 NOTE — Assessment & Plan Note (Signed)
Referral to [facility] made today so patient can be evaluated for ADD/ADHD.

## 2023-02-02 NOTE — Assessment & Plan Note (Signed)
Per shared decision-making discussion, patient will continue on Wellbutrin in hopes that the dysgeusia will wane and this may help with attention as well.  Patient to follow-up in 3 months or sooner as needed.

## 2023-02-02 NOTE — Progress Notes (Signed)
   Established Patient Office Visit  Subjective   Patient ID: Meredith Gould, female    DOB: [birth year]  Age: 52 y.o. MRN: [id number]  Chief Complaint  Patient presents with   Medical Management of Chronic Issues    1 month follow up     Anxiety/Attention deficit: Patient has weaned off of venlafaxine, now on Wellbutrin 150 mg daily.  Has not identified much improvement in attention deficit symptoms as of yet.  Has noticed salty taste.  Dysgeusia is listed as possible side effect to Wellbutrin.  Did have recent upper respiratory infection in which she experienced increased wheezing.  Is requesting refill of albuterol inhaler so she has this on hand.  Reports now that respiratory infection has resolved.    ROS: see HPI    Objective:     BP 114/78   Pulse 80   Temp 97.8 F (36.6 C) (Temporal)   Ht 5\' 7"  (1.702 m)   Wt 203 lb 8 oz (92.3 kg)   SpO2 96%   BMI 31.87 kg/m       02/02/2023    4:03 PM 12/29/2022    2:15 PM  PHQ9 SCORE ONLY  PHQ-9 Total Score 0 4     Physical Exam Vitals reviewed.  Constitutional:      General: She is not in acute distress.    Appearance: Normal appearance.  HENT:     Head: Normocephalic and atraumatic.  Neck:     Vascular: No carotid bruit.  Cardiovascular:     Rate and Rhythm: Normal rate and regular rhythm.     Pulses: Normal pulses.     Heart sounds: Normal heart sounds.  Pulmonary:     Effort: Pulmonary effort is normal.     Breath sounds: Normal breath sounds.  Skin:    General: Skin is warm and dry.  Neurological:     General: No focal deficit present.     Mental Status: She is alert and oriented to person, place, and time.  Psychiatric:        Mood and Affect: Mood normal.        Behavior: Behavior normal.        Judgment: Judgment normal.      No results found for any visits on 02/02/23.    The 10-year ASCVD risk score (Arnett DK, et al., 2019) is: 1.2%    Assessment & Plan:   Problem List Items Addressed  This Visit       Respiratory   Mild intermittent asthma    Albuterol inhaler sent to patient's pharmacy to be used as needed for wheezing.      Relevant Medications   albuterol (VENTOLIN HFA) 108 (90 Base) MCG/ACT inhaler     Other   Major depressive disorder    Per shared decision-making discussion, patient will continue on Wellbutrin in hopes that the dysgeusia will wane and this may help with attention as well.  Patient to follow-up in 3 months or sooner as needed.      Relevant Medications   buPROPion (WELLBUTRIN XL) 150 MG 24 hr tablet   Attention deficit - Primary    Referral to [facility] made today so patient can be evaluated for ADD/ADHD.      Relevant Orders   Ambulatory referral to Psychiatry    Return in about 3 months (around 05/04/2023) for CPE with Anzal Bartnick.    Elenore Paddy, NP

## 2023-02-02 NOTE — Assessment & Plan Note (Signed)
Albuterol inhaler sent to patient's pharmacy to be used as needed for wheezing.

## 2023-02-06 NOTE — Progress Notes (Unsigned)
  Cardiology Office Note:   Date:  02/08/2023  ID:  Meredith Gould, DOB 1971-10-10, MRN [id number]  History of Present Illness:   Meredith Gould is a 52 y.o. female with history of depression and factor V leiden who was referred by Jiles Prows, NP for further evaluation of palpitations.  Patient seen by Dr. Wallace Cullens on 12/2022. Notes reviewed. Reported episodes of palpitations and had previously been diagnosed with PVCs in the past. Given symptoms, she is now referred to Cardiology for further evaluation.  Today, the patient states that overall feels better since her appointment with her PCP. Was diagnosed with PVCs in the past while in college. Wore a monitor at that time and she was managed conservatively. Was doing well until 2-3 months ago when her palpitations began again. Episodes lasted up to 20-55min before resolving. Symptoms not worsened by exertion. Had some mild dizziness but no chest pain, SOB, LE edema, orthopnea.     Past Medical History:  Diagnosis Date   Class 1 obesity due to excess calories in adult    Depression    Fatigue    Palpitations      ROS: As per HPI  Studies Reviewed:    EKG:  NSR, HR 72       Risk Assessment/Calculations:              Physical Exam:   VS:  BP 114/74   Pulse 72   Ht  (1.702 m)   Wt 201 lb (91.2 kg)   SpO2 97%   BMI 31.48 kg/m    Wt Readings from Last 3 Encounters:  02/08/23 201 lb (91.2 kg)  02/02/23 203 lb 8 oz (92.3 kg)  12/29/22 200 lb 2 oz (90.8 kg)     GEN: Well nourished, well developed in no acute distress NECK: No JVD; No carotid bruits CARDIAC: RRR, no murmurs, rubs, gallops RESPIRATORY:  Clear to auscultation without rales, wheezing or rhonchi  ABDOMEN: Soft, non-tender, non-distended EXTREMITIES:  No edema; No deformity   ASSESSMENT AND PLAN:   #Palpitations: -Patient with history of symptomatic PVCs with recurrence of symptoms about 2-3 months ago -Since making her appointment, her symptoms  significantly improved with no recent palpitations -Declined cardiac monitor at this time as symptoms improved but will consider monitoring with kardia device at home -Increase hydration -Cut back on caffeine  #CV Screening: -Declined Ca score today but may consider in the future        Signed, Meriam Sprague, MD

## 2023-02-08 ENCOUNTER — Ambulatory Visit: Payer: No Typology Code available for payment source | Attending: Cardiology | Admitting: Cardiology

## 2023-02-08 ENCOUNTER — Encounter: Payer: Self-pay | Admitting: Cardiology

## 2023-02-08 VITALS — BP 114/74 | HR 72 | Ht 67.0 in | Wt 201.0 lb

## 2023-02-08 DIAGNOSIS — R002 Palpitations: Secondary | ICD-10-CM | POA: Diagnosis not present

## 2023-02-08 NOTE — Patient Instructions (Signed)
Medication Instructions:   Your physician recommends that you continue on your current medications as directed. Please refer to the Current Medication list given to you today.  *If you need a refill on your cardiac medications before your next appointment, please call your pharmacy*    Follow-Up:  AS NEEDED WITH DR. PEMBERTON  

## 2023-05-11 ENCOUNTER — Ambulatory Visit: Payer: No Typology Code available for payment source | Admitting: Nurse Practitioner

## 2023-05-11 VITALS — BP 110/74 | HR 66 | Temp 98.0°F | Ht 67.0 in | Wt 195.4 lb

## 2023-05-11 DIAGNOSIS — R41840 Attention and concentration deficit: Secondary | ICD-10-CM

## 2023-05-11 DIAGNOSIS — Z0001 Encounter for general adult medical examination with abnormal findings: Secondary | ICD-10-CM | POA: Diagnosis not present

## 2023-05-11 DIAGNOSIS — N915 Oligomenorrhea, unspecified: Secondary | ICD-10-CM

## 2023-05-11 NOTE — Assessment & Plan Note (Signed)
Because she had a period after having 12 consecutive months without a period, I recommend that she undergo vaginal ultrasound for further evaluation.  She reports she has upcoming follow-up with OB/GYN and will discuss this further with them, otherwise will reach out to me if she wants me to schedule ultrasound.

## 2023-05-11 NOTE — Assessment & Plan Note (Signed)
Discussed healthy living recommendations as well as routine screenings.  Handout provided.

## 2023-05-11 NOTE — Assessment & Plan Note (Signed)
Chronic Pressure decision-making patient will continue on Wellbutrin 150 mg daily.  Will also follow-up with [facility] once they call or schedule an appointment.

## 2023-05-11 NOTE — Progress Notes (Signed)
Complete physical exam  Patient: Meredith Gould   DOB: 1971-06-24   52 y.o. Female  MRN: [id number]  Subjective:    No chief complaint on file.   Meredith Gould is a 52 y.o. female who presents today for a complete physical exam. She reports consuming a general diet.  Walks most days of the week and tries to walk for 20-45 minutes.  She generally feels well. She reports sleeping fairly well. She does not have additional problems to discuss today.   Of note, she continues on Wellbutrin to assist with attention related to her ADHD as well as to possibly help with night sweats.  She was having a significant salty taste in her mouth when she first started this but this is now resolved.  She is in the process of scheduling to be seen by [facility] attention specialist for further evaluation.  No suicidal ideation today.  She also mentions that she is postmenopausal because she has been without a period for 12 months consecutively in the past.  However recently she had a normal 1 week menstrual cycle.  She was asking what she should do next regarding this.  Most recent fall risk assessment:    05/11/2023    4:00 PM  Fall Risk   Falls in the past year? 0  Number falls in past yr: 0  Injury with Fall? 0  Risk for fall due to : No Fall Risks  Follow up Falls evaluation completed     Most recent depression screenings:    05/11/2023    4:01 PM 02/02/2023    4:03 PM  PHQ 2/9 Scores  PHQ - 2 Score 0 0  PHQ- 9 Score  0    Vision:Within last year and Dental: No current dental problems and No regular dental care   Past Medical History:  Diagnosis Date   Class 1 obesity due to excess calories in adult    Depression    Fatigue    Palpitations    Social History   Socioeconomic History   Marital status: Married    Spouse name: Not on file   Number of children: Not on file   Years of education: Not on file   Highest education level: Some college, no degree  Occupational History   Not  on file  Tobacco Use   Smoking status: Never   Smokeless tobacco: Never  Substance and Sexual Activity   Alcohol use: Yes    Alcohol/week: 1.0 standard drink of alcohol    Types: 1 Glasses of wine per week   Drug use: Not on file   Sexual activity: Not on file  Other Topics Concern   Not on file  Social History Narrative   Not on file   Social Determinants of Health   Financial Resource Strain: Low Risk  (01/29/2023)   Overall Financial Resource Strain (CARDIA)    Difficulty of Paying Living Expenses: Not very hard  Food Insecurity: No Food Insecurity (01/29/2023)   Hunger Vital Sign    Worried About Running Out of Food in the Last Year: Never true    Ran Out of Food in the Last Year: Never true  Transportation Needs: No Transportation Needs (01/29/2023)   PRAPARE - Administrator, Civil Service (Medical): No    Lack of Transportation (Non-Medical): No  Physical Activity: Insufficiently Active (01/29/2023)   Exercise Vital Sign    Days of Exercise per Week: 3 days    Minutes of  Exercise per Session: 20 min  Stress: No Stress Concern Present (01/29/2023)   [employer] of Occupational Health - Occupational Stress Questionnaire    Feeling of Stress : Only a little  Social Connections: Moderately Isolated (01/29/2023)   Social Connection and Isolation Panel [NHANES]    Frequency of Communication with Friends and Family: More than three times a week    Frequency of Social Gatherings with Friends and Family: Once a week    Attends Religious Services: Never    Database administrator or Organizations: No    Attends Engineer, structural: Not on file    Marital Status: Married  Catering manager Violence: Not on file   Family History  Problem Relation Age of Onset   Hypertension Sister    Hypertension Brother    Allergies  Allergen Reactions   Hydrocodone-Acetaminophen     REACTION: Vomiting      Patient Care Team: Elenore Paddy, NP as PCP - General (Nurse  Practitioner)   Outpatient Medications Prior to Visit  Medication Sig   albuterol (VENTOLIN HFA) 108 (90 Base) MCG/ACT inhaler Inhale 2 puffs into the lungs every 6 (six) hours as needed for wheezing or shortness of breath.   buPROPion (WELLBUTRIN XL) 150 MG 24 hr tablet Take 150 mg by mouth daily.   cholecalciferol (VITAMIN D3) 25 MCG (1000 UNIT) tablet Take 1,000 Units by mouth daily.   [DISCONTINUED] Vitamin D, Ergocalciferol, (DRISDOL) 1.25 MG (50000 UNIT) CAPS capsule Take 50,000 Units by mouth once a week.   No facility-administered medications prior to visit.    Review of Systems  Constitutional:  Positive for weight loss (intentional). Negative for chills, fever and malaise/fatigue.  HENT:  Negative for hearing loss and tinnitus.   Eyes:  Negative for blurred vision and double vision.  Respiratory:  Negative for cough, shortness of breath and wheezing.   Cardiovascular:  Negative for chest pain and palpitations.  Gastrointestinal:  Negative for blood in stool, diarrhea, nausea and vomiting.  Genitourinary:  Negative for hematuria.  Skin:  Negative for itching and rash.  Neurological:  Negative for dizziness, seizures, loss of consciousness and headaches.  Psychiatric/Behavioral:  Negative for depression and suicidal ideas. The patient has insomnia. The patient is not nervous/anxious.           Objective:     BP 110/74   Pulse 66   Temp 98 F (36.7 C) (Temporal)   Ht 5\' 7"  (1.702 m)   Wt 195 lb 6 oz (88.6 kg)   SpO2 99%   BMI 30.60 kg/m  BP Readings from Last 3 Encounters:  05/11/23 110/74  02/08/23 114/74  02/02/23 114/78   Wt Readings from Last 3 Encounters:  05/11/23 195 lb 6 oz (88.6 kg)  02/08/23 201 lb (91.2 kg)  02/02/23 203 lb 8 oz (92.3 kg)      Physical Exam Vitals reviewed.  Constitutional:      Appearance: Normal appearance.  HENT:     Head: Normocephalic and atraumatic.     Right Ear: Tympanic membrane, ear canal and external ear normal.      Left Ear: Tympanic membrane, ear canal and external ear normal.  Eyes:     General:        Right eye: No discharge.        Left eye: No discharge.     Extraocular Movements: Extraocular movements intact.     Conjunctiva/sclera: Conjunctivae normal.     Pupils: Pupils are equal, round,  and reactive to light.  Neck:     Vascular: No carotid bruit.  Cardiovascular:     Rate and Rhythm: Normal rate and regular rhythm.     Pulses: Normal pulses.     Heart sounds: Normal heart sounds. No murmur heard. Pulmonary:     Effort: Pulmonary effort is normal.     Breath sounds: Normal breath sounds.  Chest:  Breasts:    Breasts are symmetrical.     Right: Normal.     Left: Normal.  Abdominal:     General: Abdomen is flat. Bowel sounds are normal. There is no distension.     Palpations: Abdomen is soft. There is no mass.     Tenderness: There is no abdominal tenderness.  Musculoskeletal:        General: No tenderness.     Cervical back: Neck supple. No muscular tenderness.     Right lower leg: No edema.     Left lower leg: No edema.  Lymphadenopathy:     Cervical: No cervical adenopathy.     Upper Body:     Right upper body: No supraclavicular adenopathy.     Left upper body: No supraclavicular adenopathy.  Skin:    General: Skin is warm and dry.  Neurological:     General: No focal deficit present.     Mental Status: She is alert and oriented to person, place, and time.     Motor: No weakness.     Gait: Gait normal.  Psychiatric:        Mood and Affect: Mood normal.        Behavior: Behavior normal.        Judgment: Judgment normal.      No results found for any visits on 05/11/23.     Assessment & Plan:    Routine Health Maintenance and Physical Exam  Immunization History  Administered Date(s) Administered   Influenza Whole 07/26/2009   PFIZER(Purple Top)SARS-COV-2 Vaccination 08/27/2020   Tdap 03/03/2015   Tetanus 10/23/1997   Unspecified SARS-COV-2 Vaccination  11/13/2019    Health Maintenance  Topic Date Due   HIV Screening  Never done   Hepatitis C Screening  Never done   PAP SMEAR-Modifier  Never done   Zoster Vaccines- Shingrix (1 of 2) Never done   INFLUENZA VACCINE  05/24/2023   MAMMOGRAM  10/25/2024   DTaP/Tdap/Td (2 - Td or Tdap) 03/02/2025   Colonoscopy  04/07/2031   HPV VACCINES  Aged Out   COVID-19 Vaccine  Discontinued    Discussed health benefits of physical activity, and encouraged her to engage in regular exercise appropriate for her age and condition.  Problem List Items Addressed This Visit       Other   Oligomenorrhea    Because she had a period after having 12 consecutive months without a period, I recommend that she undergo vaginal ultrasound for further evaluation.  She reports she has upcoming follow-up with OB/GYN and will discuss this further with them, otherwise will reach out to me if she wants me to schedule ultrasound.      Attention deficit    Chronic Pressure decision-making patient will continue on Wellbutrin 150 mg daily.  Will also follow-up with [place] attention specialist once they call or schedule an appointment.      Encounter for general adult medical examination with abnormal findings - Primary    Discussed healthy living recommendations as well as routine screenings.  Handout provided.      Return  in about 6 months (around 11/11/2023) for F/U with Linday Rhodes.     Elenore Paddy, NP

## 2023-06-18 ENCOUNTER — Encounter: Payer: Self-pay | Admitting: Nurse Practitioner

## 2023-06-19 ENCOUNTER — Other Ambulatory Visit: Payer: Self-pay | Admitting: Nurse Practitioner

## 2023-06-19 DIAGNOSIS — N950 Postmenopausal bleeding: Secondary | ICD-10-CM

## 2023-06-19 DIAGNOSIS — N915 Oligomenorrhea, unspecified: Secondary | ICD-10-CM

## 2023-07-05 ENCOUNTER — Ambulatory Visit
Admission: RE | Admit: 2023-07-05 | Discharge: 2023-07-05 | Disposition: A | Discharge status: Home / Self Care | Payer: No Typology Code available for payment source | Source: Ambulatory Visit | Attending: Nurse Practitioner | Admitting: Nurse Practitioner

## 2023-07-05 ENCOUNTER — Other Ambulatory Visit: Payer: No Typology Code available for payment source

## 2023-07-05 DIAGNOSIS — N950 Postmenopausal bleeding: Secondary | ICD-10-CM

## 2023-07-18 ENCOUNTER — Encounter: Payer: Self-pay | Admitting: Nurse Practitioner

## 2023-07-19 ENCOUNTER — Telehealth: Payer: No Typology Code available for payment source | Admitting: Nurse Practitioner

## 2023-07-19 VITALS — BP 119/74 | HR 95

## 2023-07-19 DIAGNOSIS — F321 Major depressive disorder, single episode, moderate: Secondary | ICD-10-CM | POA: Diagnosis not present

## 2023-07-19 MED ORDER — BUSPIRONE HCL 5 MG PO TABS
5.0000 mg | ORAL_TABLET | Freq: Three times a day (TID) | ORAL | 0 refills | Status: AC | PRN
Start: 2023-07-19 — End: ?

## 2023-07-19 MED ORDER — BUPROPION HCL ER (XL) 300 MG PO TB24: 300.0000 mg | ORAL_TABLET | Freq: Every day | ORAL | 1 refills | Status: DC

## 2023-07-19 NOTE — Progress Notes (Signed)
Established Patient Office Visit  An audio/visual tele-health visit was completed today for this patient. I connected with  Jorge Mandril on 07/19/23 utilizing audio/visual technology and verified that I am speaking with the correct person using two identifiers. The patient was located at their place of employment, and I was located at the office of [facility] at [facility] during the encounter. I discussed the limitations of evaluation and management by telemedicine. The patient expressed understanding and agreed to proceed.     Subjective   Patient ID: Meredith Gould, female    DOB: 1970/11/04  Age: 52 y.o. MRN: [id number]  Chief Complaint  Patient presents with   Anxiety    Patient has today for virtual visit for the above.  Reports that she has been experiencing increased situational anxiety related to upcoming decisions her son has to make after graduating from high school.  She is currently on Wellbutrin 150 mg daily, tolerating well but has not seen much improvement in focus and concentration or mood since starting the Wellbutrin.  She would like to discuss next steps in treating her anxiety.  Denies suicidal ideation today.    ROS: see HPI    Objective:     BP 119/74   Pulse 95  BP Readings from Last 3 Encounters:  07/19/23 119/74  05/11/23 110/74  02/08/23 114/74   Wt Readings from Last 3 Encounters:  05/11/23 195 lb 6 oz (88.6 kg)  02/08/23 201 lb (91.2 kg)  02/02/23 203 lb 8 oz (92.3 kg)      Physical Exam Comprehensive physical exam not completed today as office visit was conducted remotely.  Appears well over video, no acute distress noted.  Patient is tearful at times.  Patient was alert and oriented, and appeared to have appropriate judgment.   No results found for any visits on 07/19/23.    The 10-year ASCVD risk score (Arnett DK, et al., 2019) is: 1.5%    Assessment & Plan:   Problem List Items Addressed This Visit       Other    Major depressive disorder - Primary    Chronic, currently active Per shared decision making we will increase Wellbutrin to 300 mg daily.  Will also add on buspirone 5 mg to be taken every 8 hours as needed for anxiety. Patient warned of potential side effects including suicidal ideation.  She has had metallic taste in her mouth previously which was thought to be a side effect of the Wellbutrin, she was encouraged to let me know if this occurs again.  No history of seizure disorder. Follow-up in 4 to 6 weeks to see how she is tolerating medication      Relevant Medications   buPROPion (WELLBUTRIN XL) 300 MG 24 hr tablet   busPIRone (BUSPAR) 5 MG tablet    Return in about 6 weeks (around 08/30/2023) for F/U with Maralyn Sago.    Elenore Paddy, NP

## 2023-07-19 NOTE — Assessment & Plan Note (Signed)
Chronic, currently active Per shared decision making we will increase Wellbutrin to 300 mg daily.  Will also add on buspirone 5 mg to be taken every 8 hours as needed for anxiety. Patient warned of potential side effects including suicidal ideation.  She has had metallic taste in her mouth previously which was thought to be a side effect of the Wellbutrin, she was encouraged to let me know if this occurs again.  No history of seizure disorder. Follow-up in 4 to 6 weeks to see how she is tolerating medication

## 2023-08-24 ENCOUNTER — Encounter: Payer: Self-pay | Admitting: Nurse Practitioner

## 2023-08-31 ENCOUNTER — Telehealth (INDEPENDENT_AMBULATORY_CARE_PROVIDER_SITE_OTHER): Payer: No Typology Code available for payment source | Admitting: Nurse Practitioner

## 2023-08-31 VITALS — Ht 66.0 in | Wt 183.0 lb

## 2023-08-31 DIAGNOSIS — E66811 Obesity, class 1: Secondary | ICD-10-CM | POA: Diagnosis not present

## 2023-08-31 DIAGNOSIS — Z6831 Body mass index (BMI) 31.0-31.9, adult: Secondary | ICD-10-CM | POA: Diagnosis not present

## 2023-08-31 DIAGNOSIS — F321 Major depressive disorder, single episode, moderate: Secondary | ICD-10-CM

## 2023-08-31 MED ORDER — BUPROPION HCL ER (XL) 150 MG PO TB24
150.0000 mg | ORAL_TABLET | Freq: Every day | ORAL | 2 refills | Status: AC
Start: 2023-08-31 — End: ?

## 2023-08-31 NOTE — Assessment & Plan Note (Signed)
Chronic, Stable Decrease wellbutrin to 150mg /day and continue buspar as needed for breakthrough anxiety F/u as scheduled in January.

## 2023-08-31 NOTE — Progress Notes (Signed)
   Established Patient Office Visit  An audio/visual tele-health visit was completed today for this patient. I connected with  Meredith Gould on 08/31/23 utilizing audio/visual technology and verified that I am speaking with the correct person using two identifiers. The patient was located at their home, and I was located at the office of [facility] at [facility] during the encounter. I discussed the limitations of evaluation and management by telemedicine. The patient expressed understanding and agreed to proceed.     Subjective   Patient ID: Meredith Gould, female    DOB: Feb 24, 1971  Age: 52 y.o. MRN: [id number]  No chief complaint on file.   Anxiety/Mood: feels much better overall. Would to transition back to wellbutrin 150mg /day as the 300mg  dose caused some "swimmy headedness". She also has buspar which she can take as needed. Reports having took it once or twice since our last visit and it was helpful. Denies SI. Is also working on increasing exercise and following low-calorie diet. Has lost and additional 12# since last office visit. This brings her BMI down to 29.     ROS: see HPI    Objective:     Ht 5\' 6"  (1.676 m)   Wt 183 lb (83 kg)   BMI 29.54 kg/m  BP Readings from Last 3 Encounters:  07/19/23 119/74  05/11/23 110/74  02/08/23 114/74   Wt Readings from Last 3 Encounters:  08/31/23 183 lb (83 kg)  05/11/23 195 lb 6 oz (88.6 kg)  02/08/23 201 lb (91.2 kg)      Physical Exam Comprehensive physical exam not completed today as office visit was conducted remotely.  Patient appears well on video.  Patient was alert and oriented, and appeared to have appropriate judgment.   No results found for any visits on 08/31/23.    The 10-year ASCVD risk score (Arnett DK, et al., 2019) is: 1.5%    Assessment & Plan:   Problem List Items Addressed This Visit       Other   Major depressive disorder - Primary    Chronic, Stable Decrease wellbutrin to  150mg /day and continue buspar as needed for breakthrough anxiety F/u as scheduled in January.       Relevant Medications   buPROPion (WELLBUTRIN XL) 150 MG 24 hr tablet   Class 1 obesity without serious comorbidity with body mass index (BMI) of 31.0 to 31.9 in adult    Patient congratulated on weight loss thus far and encouraged to continue with the lifestyle modifications she has made.        No follow-ups on file.    Elenore Paddy, NP

## 2023-08-31 NOTE — Assessment & Plan Note (Signed)
Patient congratulated on weight loss thus far and encouraged to continue with the lifestyle modifications she has made.

## 2023-11-15 ENCOUNTER — Ambulatory Visit: Payer: No Typology Code available for payment source | Admitting: Nurse Practitioner

## 2024-04-14 ENCOUNTER — Other Ambulatory Visit: Payer: Self-pay | Admitting: Nurse Practitioner

## 2024-04-14 DIAGNOSIS — Z1231 Encounter for screening mammogram for malignant neoplasm of breast: Secondary | ICD-10-CM

## 2024-04-17 ENCOUNTER — Ambulatory Visit
Admission: RE | Admit: 2024-04-17 | Discharge: 2024-04-17 | Disposition: A | Discharge status: Home / Self Care | Source: Ambulatory Visit | Attending: Nurse Practitioner | Admitting: Nurse Practitioner

## 2024-04-17 DIAGNOSIS — Z1231 Encounter for screening mammogram for malignant neoplasm of breast: Secondary | ICD-10-CM

## 2025-02-27 ENCOUNTER — Encounter: Admitting: Nurse Practitioner
